# Patient Record
Sex: Female | Born: 1995 | Race: White | Hispanic: No | Marital: Single | Smoking: Never smoker
Health system: Southern US, Community
[De-identification: ages and names within clinical notes are randomized; demographics above are authoritative.]

## PROBLEM LIST (undated history)

## (undated) DIAGNOSIS — G9050 Complex regional pain syndrome I, unspecified: Secondary | ICD-10-CM

## (undated) HISTORY — DX: Complex regional pain syndrome I, unspecified: G90.50

## (undated) HISTORY — PX: ARM DEBRIDEMENT: SHX890

## (undated) HISTORY — PX: WISDOM TOOTH EXTRACTION: SHX21

---
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
## 2017-01-26 ENCOUNTER — Ambulatory Visit (INDEPENDENT_AMBULATORY_CARE_PROVIDER_SITE_OTHER): Payer: BC Managed Care – PPO | Admitting: Neurology

## 2017-01-26 ENCOUNTER — Encounter: Payer: Self-pay | Admitting: Neurology

## 2017-01-26 VITALS — BP 112/70 | HR 88 | Ht 64.0 in | Wt 112.0 lb

## 2017-01-26 DIAGNOSIS — R51 Headache: Secondary | ICD-10-CM | POA: Diagnosis not present

## 2017-01-26 DIAGNOSIS — G43709 Chronic migraine without aura, not intractable, without status migrainosus: Secondary | ICD-10-CM

## 2017-01-26 DIAGNOSIS — R519 Headache, unspecified: Secondary | ICD-10-CM

## 2017-01-26 MED ORDER — SUMATRIPTAN SUCCINATE 100 MG PO TABS: ORAL_TABLET | ORAL | 3 refills | Status: DC

## 2017-01-26 MED ORDER — TOPIRAMATE 50 MG PO TABS: 50.0000 mg | ORAL_TABLET | Freq: Every day | ORAL | 3 refills | Status: DC

## 2017-01-26 NOTE — Progress Notes (Signed)
NEUROLOGY CONSULTATION NOTE  Natasha James MRN: [id number] DOB: 01-Dec-1996  Referring provider: Dr. Henrietta Hoover Primary care provider: Dr. Melinda Crutch  Reason for consult:  Chronic migraine  HISTORY OF PRESENT ILLNESS: Natasha James is a 21 year old female with CRPS who presents for headache.  She is accompanied by her friend who supplements history.  Onset:  She has had headaches since around age 88.  They used to occur once a month.  Beginning in September 2017, then have become almost daily.  She is not certain about a specific trigger, but she was experiencing increased pain due to her CRPS, which is now controlled.  However, headaches persisted. Location:  Bi-frontal/temporal Quality:  Pressure/throbbing Intensity:  7-8/10 Aura:  no Prodrome:  no Associated symptoms:  Nausea, photophobia, blurred vision, decreased concentration.  She has not had any new worse headache of her life, waking up from sleep Duration:  Usually 1 day (up to 2 to 3 days) Frequency:  At least 15 headache days per month (usually has dull headache most days of month). Frequency of abortive medication: none Triggers/exacerbating factors:  uncertain Relieving factors:  no Activity:  Aggravates it. Of note, she also has diffuse joint pain.  Past NSAIDS:  Advil Past analgesics:  Tylenol, Excedrin Past abortive triptans:  no Past muscle relaxants:  no Past anti-emetic:  no Past antihypertensive medications:  no Past antidepressant medications:  Yes but cannot remember (took it for depression) Past anticonvulsant medications:  Lyrica and gabapentin (for CRPS) Past vitamins/Herbal/Supplements:  no Past antihistamines/decongestants:  no Other past therapies:  no  Current NSAIDS:  no Current analgesics:  no Current triptans:  no Current anti-emetic:  no Current muscle relaxants:  no Current anti-anxiolytic:  no Current sleep aide:  no Current Antihypertensive medications:  no Current  Antidepressant medications:  no Current Anticonvulsant medications:  no Current Vitamins/Herbal/Supplements:  Vitamin C, D Current Antihistamines/Decongestants:  no Other therapy:  no  Caffeine:  no Alcohol:  no Smoker:  no Diet:  Needs to drink more water.  Drinks soda Exercise:  No due to joint pain. Depression/anxiety:  depression Sleep hygiene:  good Family history of headache:  Mom  Labs this month: CBC with WBC 7.8, HGB 15.2, HCT 45.6, PLT 247; CMP with Na 144, K 4.3, Cl 100, CO2 22, BUN 15, Cr 0.79, glucose 93, total bili 0.3, ALP 75, AST 45 and ALT 47; B12 532; TSH 4.270; ferritin 36, RA negative; Hep B panel negative; ANCA negative; CRP negative; ANA negative.  Her LFTs are being monitored.  PAST MEDICAL HISTORY: Past Medical History:  Diagnosis Date  . CRPS (complex regional pain syndrome type I)     PAST SURGICAL HISTORY: Past Surgical History:  Procedure Laterality Date  . ARM DEBRIDEMENT     ulner shortening  . WISDOM TOOTH EXTRACTION      MEDICATIONS: No current outpatient prescriptions on file prior to visit.   No current facility-administered medications on file prior to visit.     ALLERGIES: Allergies  Allergen Reactions  . Codeine Itching and Rash  . Penicillins Nausea And Vomiting    FAMILY HISTORY: Family History  Problem Relation Age of Onset  . Chronic fatigue Mother   . Fibromyalgia Mother   . Hypertension Father     SOCIAL HISTORY: Social History   Social History  . Marital status: Unknown    Spouse name: N/A  . Number of children: N/A  . Years of education: N/A   Occupational History  . Not on  file.   Social History Main Topics  . Smoking status: Never Smoker  . Smokeless tobacco: Never Used  . Alcohol use No  . Drug use: No  . Sexual activity: Not on file   Other Topics Concern  . Not on file   Social History Narrative  . No narrative on file    REVIEW OF SYSTEMS: Constitutional: No fevers, chills, or sweats, no  generalized fatigue, change in appetite Eyes: No visual changes, double vision, eye pain Ear, nose and throat: No hearing loss, ear pain, nasal congestion, sore throat Cardiovascular: No chest pain, palpitations Respiratory:  No shortness of breath at rest or with exertion, wheezes GastrointestinaI: No nausea, vomiting, diarrhea, abdominal pain, fecal incontinence Genitourinary:  No dysuria, urinary retention or frequency Musculoskeletal:  No neck pain, back pain Integumentary: No rash, pruritus, skin lesions Neurological: as above Psychiatric: No depression, insomnia, anxiety Endocrine: No palpitations, fatigue, diaphoresis, mood swings, change in appetite, change in weight, increased thirst Hematologic/Lymphatic:  No purpura, petechiae. Allergic/Immunologic: no itchy/runny eyes, nasal congestion, recent allergic reactions, rashes  PHYSICAL EXAM: Vitals:   01/26/17 1458  BP: 112/70  Pulse: 88   General: No acute distress.  Patient appears well-groomed.  Head:  Normocephalic/atraumatic Eyes:  fundi examined but not visualized Neck: supple, no paraspinal tenderness, full range of motion Back: No paraspinal tenderness Heart: regular rate and rhythm Lungs: Clear to auscultation bilaterally. Vascular: No carotid bruits. Neurological Exam: Mental status: alert and oriented to person, place, and time, recent and remote memory intact, fund of knowledge intact, attention and concentration intact, speech fluent and not dysarthric, language intact. Cranial nerves: CN I: not tested CN II: pupils equal, round and reactive to light, visual fields intact CN III, IV, VI:  full range of motion, no nystagmus, no ptosis CN V: facial sensation intact CN VII: upper and lower face symmetric CN VIII: hearing intact CN IX, X: gag intact, uvula midline CN XI: sternocleidomastoid and trapezius muscles intact CN XII: tongue midline Bulk & Tone: normal, no fasciculations. Motor:  5/5 throughout    Sensation: temperature and vibration sensation intact. Deep Tendon Reflexes:  2+ throughout, toes downgoing.  Finger to nose testing:  Without dysmetria.  Heel to shin:  Without dysmetria.  Gait:  Normal station and stride.  Able to turn and tandem walk. Romberg negative.  IMPRESSION: Chronic migraines, worsening frequency from once a month to almost daily  PLAN: 1.  We will start topiramate (Topamax) 50mg  at bedtime.  We discussed side effects, including adverse effects on an unborn baby, so advised not to get pregnant while taking this medication. 2.  Sumatriptan 100mg  for abortive therapy. 3.  Limit use of pain relievers to no more than 2 days out of the week.  These medications include acetaminophen, ibuprofen, triptans and narcotics.  This will help reduce risk of rebound headaches. 4.  Be aware of common food triggers such as processed sweets, processed foods with nitrites (such as deli meat, hot dogs, sausages), foods with MSG, alcohol (such as wine), chocolate, certain cheeses, certain fruits (dried fruits, some citrus fruit), vinegar, diet soda. 4.  Avoid caffeine 5.  Routine exercise 6.  Proper sleep hygiene 7.  Stay adequately hydrated with water 8.  Keep a headache diary. 9.  Maintain proper stress management. 10.  Do not skip meals. 11.  Consider supplements:  Magnesium citrate 400mg  to 600mg  daily, riboflavin 400mg , Coenzyme Q 10 100mg  three times daily 12.  Due to persistent worsening increased headaches, we will get  MRI of brain without contrast. 13.  Follow up in 3 months but contact me in 4 weeks with update.  Thank you for allowing me to take part in the care of this patient.  Shon MilletAdam Jaffe, DO  CC:  Atha StarksKim Kylstra, MD  Linus Makoenis Sutherland-Phillips, MD

## 2017-01-26 NOTE — Patient Instructions (Addendum)
Migraine Recommendations: 1.  We will start topiramate (Topamax) 50mg  tablets.  We will increase the dose as follows to goal of 100mg  twice daily:      Morning Evening Week 1:   0.5 tab  0.5 tab Week 2:    1 tab  1 tab Week 3:   1.5 tabs 1.5 tabs Week 4 and thereafter 2 tabs  2 tabs.  Possible side effects include: impaired thinking, sedation, paresthesias (numbness and tingling) and weight loss.  It may cause dehydration and there is a small risk for kidney stones, so make sure to stay hydrated with water during the day.  There is also a very small risk for glaucoma, so if you notice any change in your vision while taking this medication, see an ophthalmologist.  As topiramate may have adverse effects on an unborn baby, do not get pregnant while taking this medication. 2.  Take sumatriptan 100mg  at earliest onset of headache.  May repeat dose once in 2 hours if needed.  Do not exceed two tablets in 24 hours. 3.  Limit use of pain relievers to no more than 2 days out of the week.  These medications include acetaminophen, ibuprofen, triptans and narcotics.  This will help reduce risk of rebound headaches. 4.  Be aware of common food triggers such as processed sweets, processed foods with nitrites (such as deli meat, hot dogs, sausages), foods with MSG, alcohol (such as wine), chocolate, certain cheeses, certain fruits (dried fruits, some citrus fruit), vinegar, diet soda. 4.  Avoid caffeine 5.  Routine exercise 6.  Proper sleep hygiene 7.  Stay adequately hydrated with water 8.  Keep a headache diary. 9.  Maintain proper stress management. 10.  Do not skip meals. 11.  Consider supplements:  Magnesium citrate 400mg  to 600mg  daily, riboflavin 400mg , Coenzyme Q 10 100mg  three times daily 12.  Since you have been having worsening increased headaches, we will get MRI of brain without contrast. We have sent a referral to [facility] and they will call you directly to schedule your  appt. They are located at [address] If you need to contact them directly please call [phone]. 13.  Follow up in 3 months but contact me in 4 weeks with update.

## 2017-01-26 NOTE — Progress Notes (Signed)
Note sent to doctor's listed.

## 2017-02-03 ENCOUNTER — Ambulatory Visit
Admission: RE | Admit: 2017-02-03 | Discharge: 2017-02-03 | Disposition: A | Discharge status: Home / Self Care | Payer: BC Managed Care – PPO | Source: Ambulatory Visit | Attending: Neurology | Admitting: Neurology

## 2017-02-03 ENCOUNTER — Telehealth: Payer: Self-pay | Admitting: Neurology

## 2017-02-03 DIAGNOSIS — R9082 White matter disease, unspecified: Secondary | ICD-10-CM

## 2017-02-03 DIAGNOSIS — G43709 Chronic migraine without aura, not intractable, without status migrainosus: Secondary | ICD-10-CM

## 2017-02-03 DIAGNOSIS — R51 Headache: Principal | ICD-10-CM

## 2017-02-03 DIAGNOSIS — R519 Headache, unspecified: Secondary | ICD-10-CM

## 2017-02-03 MED ORDER — NAPROXEN 500 MG PO TABS: 500.0000 mg | ORAL_TABLET | Freq: Two times a day (BID) | ORAL | 0 refills | Status: DC | PRN

## 2017-02-03 NOTE — Telephone Encounter (Signed)
Order's placed. Spoke with patient. Will stop by to have labs drawn. Will call to schedule MRA and LP at [facility] ([address] [place], [place] [address] ph: [phone] fax: [phone]).

## 2017-02-03 NOTE — Telephone Encounter (Signed)
[facility].  It revealed nonspecific punctate hyperintensities in the subcortical white matter.  It seems more prominent that sequela of migraine.  I explained that we will need to pursue further testing, specifically to evaluate for a vasculitis.  1.  We will check bloodwork for Sjogren's, ACE and hypercoagulable panel. 2.  We will have to set up for a lumbar puncture to test the CSF for cell count with diff, protein, glucose, oligoclonal bands, IgG index and ACE. 3.  We will schedule for MRA of the head.  She has a follow up appointment in June, but I would like to move that up soon after all the testing is completed.  Since there may be a vasculitis, I instructed to stop the sumatriptan for now and I will instead prescribe her naproxen 500mg , which she may take every 12 hours as needed for acute headache, but limited to no more than 2 days out of the week.  I answered all questions to the best of my ability.

## 2017-02-03 NOTE — Addendum Note (Signed)
Addended by: Sheilah MinsFOX, Latica Hohmann A on: 02/03/2017 01:00 PM   Modules accepted: Orders

## 2017-02-05 ENCOUNTER — Other Ambulatory Visit: Payer: BC Managed Care – PPO

## 2017-02-05 ENCOUNTER — Other Ambulatory Visit: Payer: Self-pay

## 2017-02-05 DIAGNOSIS — R9082 White matter disease, unspecified: Secondary | ICD-10-CM

## 2017-02-06 ENCOUNTER — Encounter: Payer: Self-pay | Admitting: Radiology

## 2017-02-06 ENCOUNTER — Ambulatory Visit
Admission: RE | Admit: 2017-02-06 | Discharge: 2017-02-06 | Disposition: A | Discharge status: Home / Self Care | Payer: BC Managed Care – PPO | Source: Ambulatory Visit | Attending: Neurology | Admitting: Neurology

## 2017-02-06 VITALS — BP 103/68 | HR 72

## 2017-02-06 DIAGNOSIS — R9082 White matter disease, unspecified: Secondary | ICD-10-CM

## 2017-02-06 LAB — CSF CELL COUNT WITH DIFFERENTIAL
RBC COUNT CSF: 4 {cells}/uL (ref 0–10)
WBC, CSF: 0 cells/uL (ref 0–5)

## 2017-02-06 LAB — PROTEIN, CSF: Total Protein, CSF: 40 mg/dL (ref 15–45)

## 2017-02-06 LAB — SJOGRENS SYNDROME-B EXTRACTABLE NUCLEAR ANTIBODY: SSB (La) (ENA) Antibody, IgG: <1

## 2017-02-06 LAB — SJOGRENS SYNDROME-A EXTRACTABLE NUCLEAR ANTIBODY: SSA (RO) (ENA) ANTIBODY, IGG: NEGATIVE

## 2017-02-06 LAB — GLUCOSE, CSF: GLUCOSE CSF: 71 mg/dL (ref 43–76)

## 2017-02-06 NOTE — Discharge Instructions (Signed)

## 2017-02-06 NOTE — Progress Notes (Signed)
One SST [facility] labs on second attempt; both sites unremarkable.  jkl

## 2017-02-07 LAB — RFX DRVVT SCR W/RFLX CONF 1:1 MIX: dRVVT Screen: 26 s (ref <=45)

## 2017-02-07 LAB — RFLX HEXAGONAL PHASE CONFIRM: HEXAGONAL PHASE CONFIRM: NEGATIVE

## 2017-02-07 LAB — RFX PTT-LA W/RFX TO HEX PHASE CONF: PTT-LA Screen: 43 s — ABNORMAL HIGH (ref <=40)

## 2017-02-07 LAB — ANGIOTENSIN CONVERTING ENZYME, CSF: ACE, CSF: 3 U/L (ref <=15)

## 2017-02-08 ENCOUNTER — Ambulatory Visit
Admission: RE | Admit: 2017-02-08 | Discharge: 2017-02-08 | Disposition: A | Discharge status: Home / Self Care | Payer: BC Managed Care – PPO | Source: Ambulatory Visit | Attending: Neurology | Admitting: Neurology

## 2017-02-08 DIAGNOSIS — R9082 White matter disease, unspecified: Secondary | ICD-10-CM

## 2017-02-09 ENCOUNTER — Telehealth: Payer: Self-pay | Admitting: Neurology

## 2017-02-09 LAB — HYPERCOAGULABLE PANEL, COMPREHENSIVE
AntiThromb III Func: 109 % activity (ref 80–120)
Anticardiolipin IgM: <12 [MPL'U]
Beta-2 Glyco I IgG: 9 SGU (ref <=20)
Beta-2-Glycoprotein I IgM: <9 SMU (ref <=20)
PROTEIN S ANTIGEN, TOTAL: 81 % (ref 70–140)
Protein C Activity: 86 % (ref 70–180)
Protein C Antigen: 95 % (ref 70–140)
Protein S Activity: 64 % (ref 60–140)

## 2017-02-09 LAB — CNS IGG SYNTHESIS RATE, CSF+BLOOD
ALBUMIN, SERUM(NEPH): 4.2 g/dL (ref 3.7–5.1)
Albumin, CSF: 21.5 mg/dL (ref 8.0–42.0)
IGG INDEX, CSF: 0.42 (ref <0.66)
IGG, SERUM: 834 mg/dL (ref 694–1618)
IgG, CSF: 1.8 mg/dL (ref 0.8–7.7)
MS CNS IgG Synthesis Rate: -3.7 mg/24 h (ref <=3.3)

## 2017-02-09 NOTE — Telephone Encounter (Signed)
-----   Message from Drema DallasAdam R Jaffe, DO sent at 02/09/2017  7:18 AM EDT ----- MRA of head is normal.  Still waiting on results of CSF

## 2017-02-09 NOTE — Telephone Encounter (Signed)
Patient made aware.

## 2017-02-10 ENCOUNTER — Other Ambulatory Visit: Payer: Self-pay | Admitting: Family Medicine

## 2017-02-10 DIAGNOSIS — R102 Pelvic and perineal pain: Secondary | ICD-10-CM

## 2017-02-10 DIAGNOSIS — R1084 Generalized abdominal pain: Secondary | ICD-10-CM

## 2017-02-10 LAB — CSF CULTURE
GRAM STAIN: NONE SEEN
ORGANISM ID, BACTERIA: NO GROWTH

## 2017-02-10 LAB — OLIGOCLONAL BANDS, CSF + SERM

## 2017-02-10 LAB — CSF CULTURE W GRAM STAIN: Gram Stain: NONE SEEN

## 2017-02-11 LAB — ANAEROBIC CULTURE
GRAM STAIN: NONE SEEN
GRAM STAIN: NONE SEEN

## 2017-02-16 ENCOUNTER — Telehealth: Payer: Self-pay | Admitting: Neurology

## 2017-02-16 NOTE — Telephone Encounter (Signed)
Spoke to patient. Gave results. Patient verbalized understanding. 

## 2017-02-16 NOTE — Telephone Encounter (Signed)
Pls let her know the bloodwork and spinal tap results are normal. Thanks

## 2017-02-16 NOTE — Telephone Encounter (Signed)
Patient wants to know the test result of the labs and the spinal tap [phone]

## 2017-02-23 ENCOUNTER — Ambulatory Visit
Admission: RE | Admit: 2017-02-23 | Discharge: 2017-02-23 | Disposition: A | Discharge status: Home / Self Care | Payer: BC Managed Care – PPO | Source: Ambulatory Visit | Attending: Family Medicine | Admitting: Family Medicine

## 2017-02-23 ENCOUNTER — Telehealth: Payer: Self-pay | Admitting: Neurology

## 2017-02-23 DIAGNOSIS — R1084 Generalized abdominal pain: Secondary | ICD-10-CM

## 2017-02-23 DIAGNOSIS — R102 Pelvic and perineal pain: Secondary | ICD-10-CM

## 2017-02-23 NOTE — Telephone Encounter (Signed)
Patient says she forgot to give 4 week up date when I called earlier w/ lab results. Update:  HA's have increased daily. Does not feel topamax or sumatriptan are helping.

## 2017-02-23 NOTE — Telephone Encounter (Signed)
Called patient. Gave lab results and instructions. Patient verbalized understanding.   

## 2017-02-23 NOTE — Telephone Encounter (Signed)
Patient called back with some questions. Thanks

## 2017-02-23 NOTE — Telephone Encounter (Signed)
Patient needs to talk to someone about what Dr Everlena CooperJaffe wants her to do now

## 2017-02-23 NOTE — Telephone Encounter (Signed)
-----   Message from Drema DallasAdam R Jaffe, DO sent at 02/23/2017  7:16 AM EDT ----- All labs look okay.  No further testing at this time.  She should follow up as scheduled.

## 2017-02-24 MED ORDER — RIZATRIPTAN BENZOATE 10 MG PO TBDP: 10.0000 mg | ORAL_TABLET | ORAL | 2 refills | Status: DC | PRN

## 2017-02-24 MED ORDER — TOPIRAMATE 100 MG PO TABS
100.0000 mg | ORAL_TABLET | Freq: Every day | ORAL | 2 refills | Status: DC
Start: 2017-02-24 — End: 2023-03-30

## 2017-02-24 NOTE — Telephone Encounter (Signed)
Patient made aware. Prescriptions sent to pharmacy.

## 2017-02-24 NOTE — Telephone Encounter (Signed)
1.  I would increase topiramate to 100mg  at bedtime. 2.  Instead of sumatriptan, she should take rizatriptan 10mg  at earliest onset of headache.  May repeat dose once after two hours if needed (not to exceed 2 tablets in 24 hours).  She should contact us if it is not effective. 3.  She should limit use of pain relievers to no more than 2 days out of the week (this includes Tylenol, ibuprofen, Excedrin, Advil, Motrin, Aleve). 4.  In 4 weeks, she should contact us again and we can adjust topamax if needed

## 2017-02-25 ENCOUNTER — Telehealth: Payer: Self-pay | Admitting: Neurology

## 2017-02-25 NOTE — Telephone Encounter (Signed)
Spoke to pt. Advised copies of testing ready for pick up @ front desk. Pt verbalized understanding.

## 2017-02-25 NOTE — Telephone Encounter (Signed)
Patient would like a copy of her MRA, MRI and blood tests. She asked could she pick the copies up? She would like you to call her when ready. Thanks

## 2017-05-07 ENCOUNTER — Ambulatory Visit: Payer: BC Managed Care – PPO | Admitting: Neurology

## 2017-07-24 IMAGING — US US PELVIS COMPLETE
1 series · 14 of 25 positions shown · non-contrast
Comparison: None in PACs

CLINICAL DATA: Right-sided pelvic pain for the past month. The
patient has an IUD which has been in place for the past year.



[Series 1: us pelvis complete · 0.15mm/px · 14 of 63 slices shown]
[im 1/63]
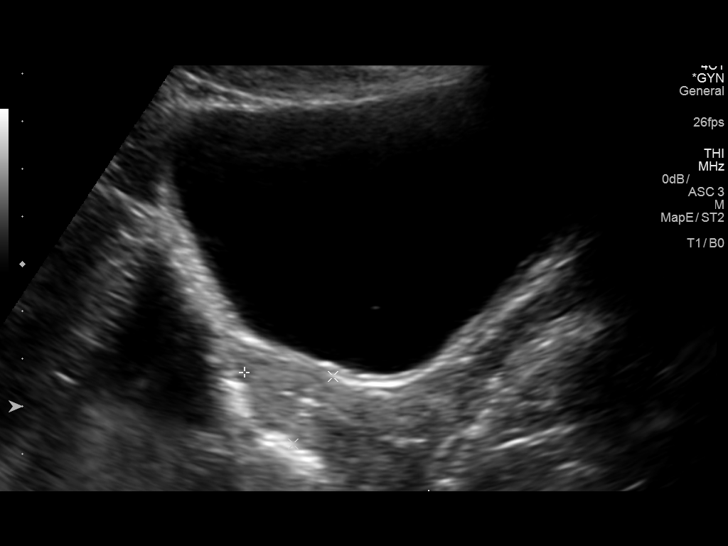
[im 6/63]
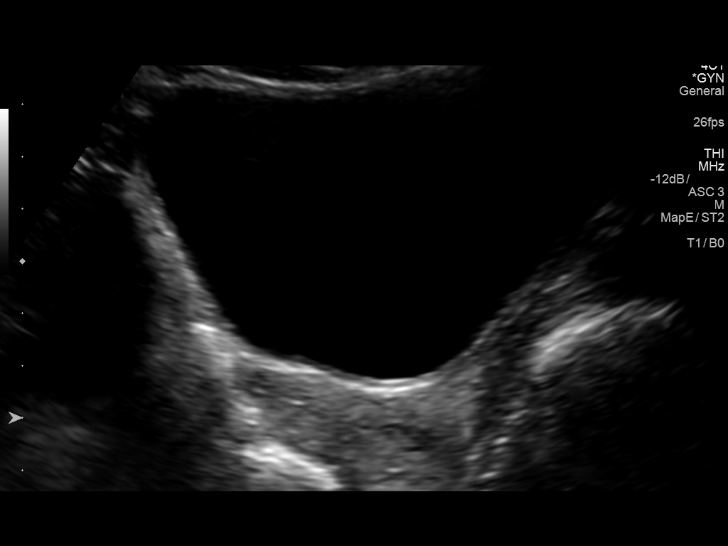
[im 11/63]
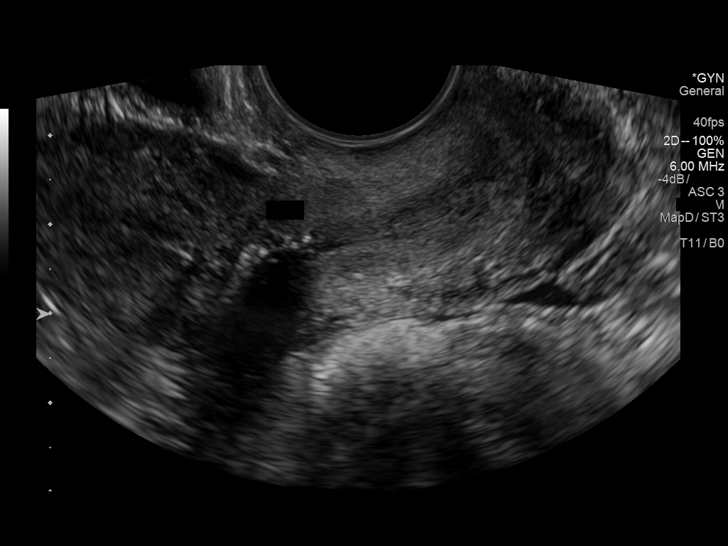
[im 16/63]
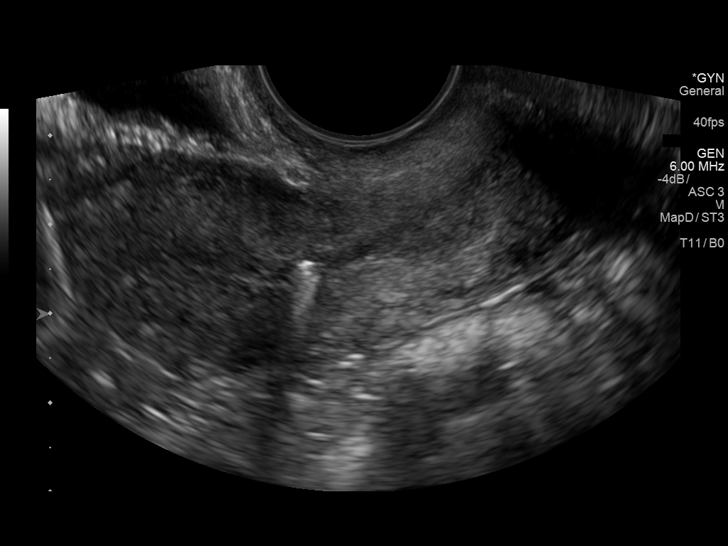
[im 21/63]
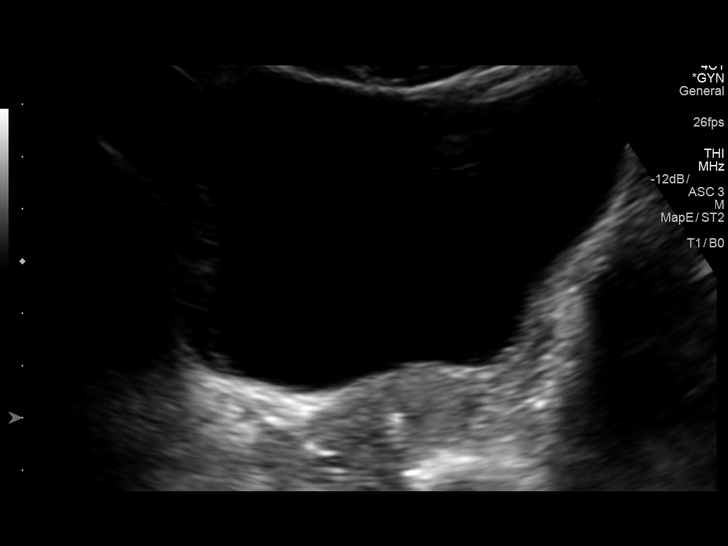
[im 24/63]
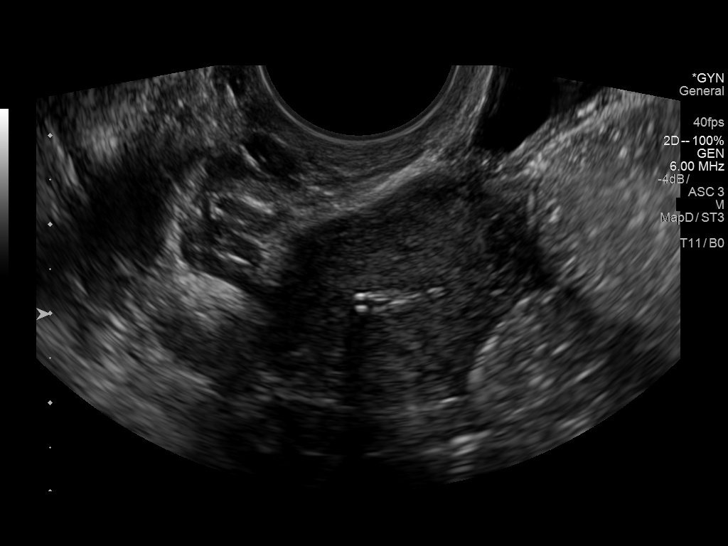
[im 29/63]
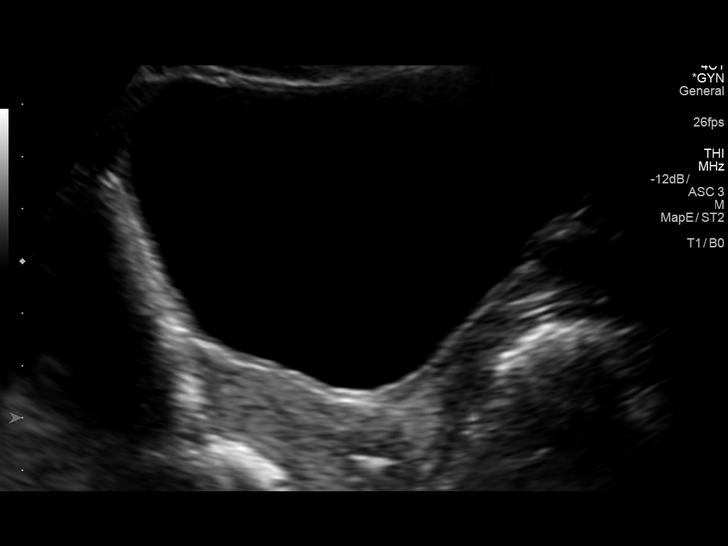
[im 34/63]
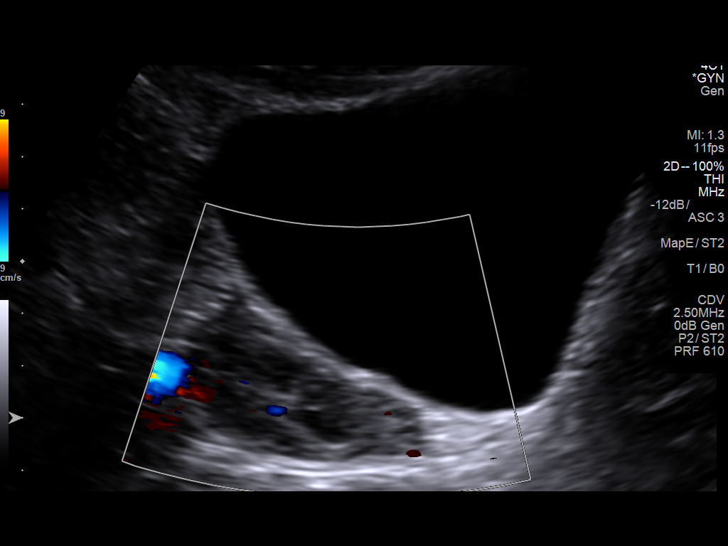
[im 39/63]
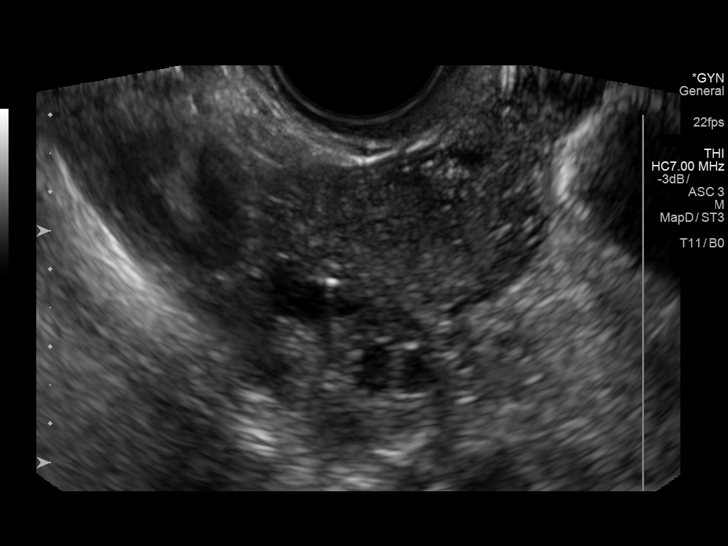
[im 42/63]
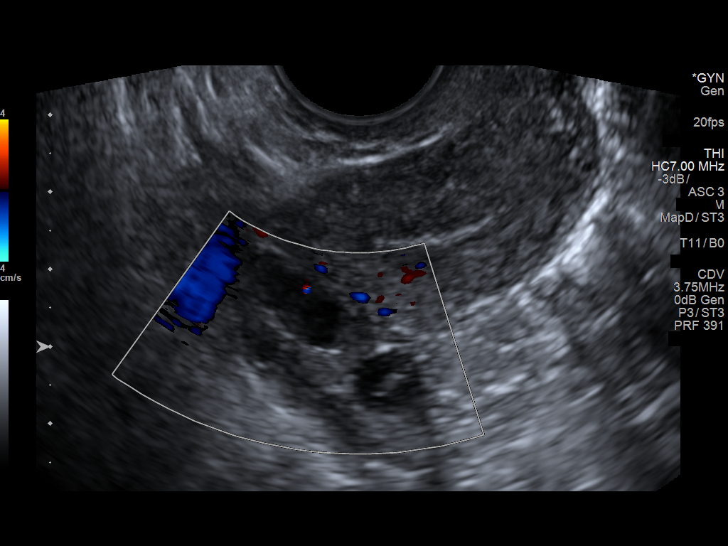
[im 47/63]
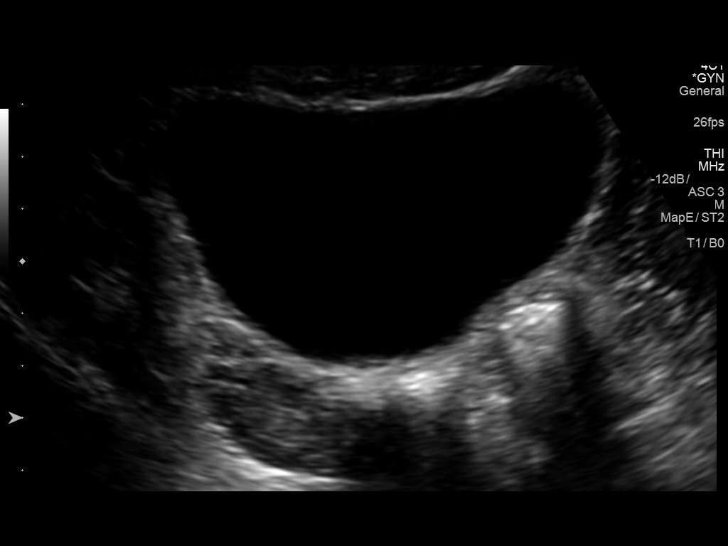
[im 52/63]
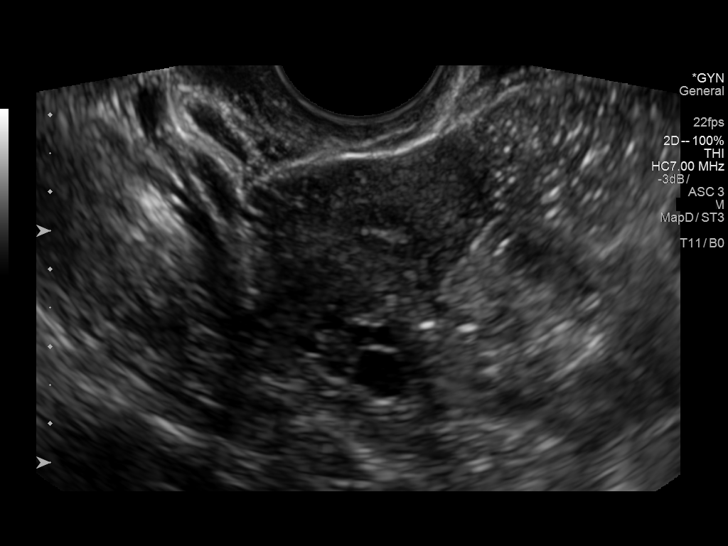
[im 57/63]
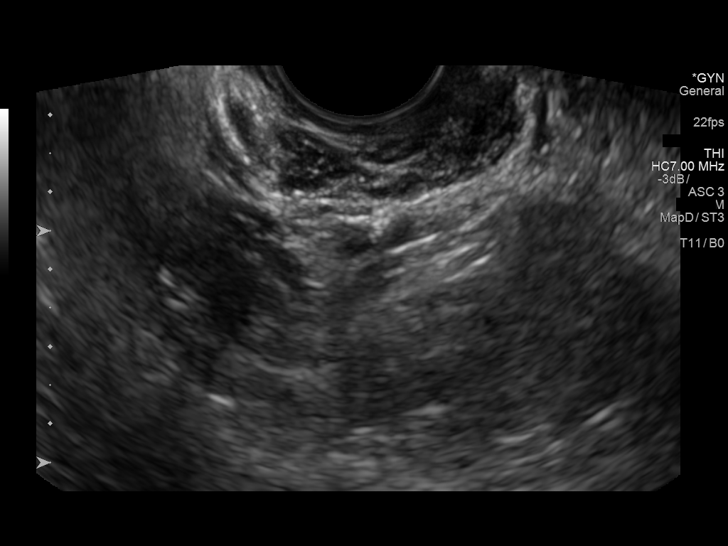
[im 63/63]
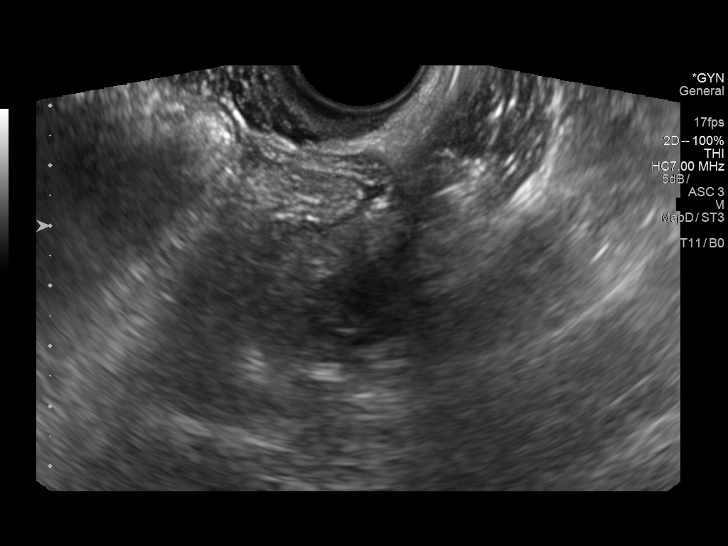

[14 of 25 positions shown; findings below may reference images not displayed]

FINDINGS: Uterus

Measurements: 5.3 x 2.2 x 2.4 cm. No fibroids or other mass
visualized.

Endometrium

Thickness: 2.8 mm.  No focal abnormality visualized.

Right ovary

Measurements: 3.8 x 1.8 x 1.7 cm. Normal appearance/no adnexal mass.

Left ovary

Measurements: The left ovary was not visualized.. No adnexal mass is
observed.

Other findings

No abnormal free fluid.
IMPRESSION: Nonvisualization of the left ovary. Normal appearance of the uterus,
endometrium, and right ovary. No adnexal masses are observed.

## 2018-09-15 ENCOUNTER — Other Ambulatory Visit: Payer: Self-pay | Admitting: Neurology

## 2018-09-16 ENCOUNTER — Other Ambulatory Visit: Payer: Self-pay | Admitting: Neurology

## 2018-09-17 ENCOUNTER — Other Ambulatory Visit: Payer: Self-pay | Admitting: Neurology

## 2018-09-17 DIAGNOSIS — G43719 Chronic migraine without aura, intractable, without status migrainosus: Secondary | ICD-10-CM

## 2018-09-17 DIAGNOSIS — G4452 New daily persistent headache (NDPH): Secondary | ICD-10-CM

## 2018-09-17 DIAGNOSIS — G43019 Migraine without aura, intractable, without status migrainosus: Secondary | ICD-10-CM

## 2018-09-25 ENCOUNTER — Ambulatory Visit
Admission: RE | Admit: 2018-09-25 | Discharge: 2018-09-25 | Disposition: A | Discharge status: Home / Self Care | Payer: BLUE CROSS/BLUE SHIELD | Source: Ambulatory Visit | Attending: Neurology | Admitting: Neurology

## 2018-09-25 DIAGNOSIS — G4452 New daily persistent headache (NDPH): Secondary | ICD-10-CM

## 2018-09-25 DIAGNOSIS — G43719 Chronic migraine without aura, intractable, without status migrainosus: Secondary | ICD-10-CM

## 2018-09-25 DIAGNOSIS — G43019 Migraine without aura, intractable, without status migrainosus: Secondary | ICD-10-CM

## 2018-09-25 MED ORDER — GADOBENATE DIMEGLUMINE 529 MG/ML IV SOLN
10.0000 mL | Freq: Once | INTRAVENOUS | Status: AC | PRN
  Administered 2018-09-25: 10 mL via INTRAVENOUS

## 2023-03-30 ENCOUNTER — Encounter: Payer: Self-pay | Admitting: Cardiology

## 2023-03-30 ENCOUNTER — Ambulatory Visit: Payer: BC Managed Care – PPO | Admitting: Cardiology

## 2023-03-30 VITALS — BP 96/66 | HR 76 | Ht 64.0 in | Wt 144.0 lb

## 2023-03-30 DIAGNOSIS — G90A Postural orthostatic tachycardia syndrome (POTS): Secondary | ICD-10-CM

## 2023-03-30 DIAGNOSIS — R6889 Other general symptoms and signs: Secondary | ICD-10-CM

## 2023-03-30 NOTE — Progress Notes (Unsigned)
Patient referred by Atha Starks, MD for decreased exercise tolerance  Subjective:   Natasha James, female    DOB: 26-Oct-1996, 26 y.o.   MRN: [id number]   Chief Complaint  Patient presents with   POTS (postural orthostatic tachycardia syndrome)   New Patient (Initial Visit)     HPI  27 y.o. Caucasian female with complex regional pain syndrome, referred for decreased exercise tolerance and lightheadedness.  Patient works at [employer] job.  She currently, her physical activity is extremely limited due to exercise-induced shortness of breath and lightheadedness.  Previously, she used to participate in competitive gymnastics, but used to have similar symptoms and back pain.  She denies any specific chest pain.  She does not drink alcohol or caffeine routinely.     Past Medical History:  Diagnosis Date   CRPS (complex regional pain syndrome type I)      Past Surgical History:  Procedure Laterality Date   ARM DEBRIDEMENT     ulner shortening   WISDOM TOOTH EXTRACTION       Social History   Tobacco Use  Smoking Status Never  Smokeless Tobacco Never    Social History   Substance and Sexual Activity  Alcohol Use No     Family History  Problem Relation Age of Onset   Chronic fatigue Mother    Fibromyalgia Mother    Hypertension Father      Current Outpatient Medications:    DULoxetine (CYMBALTA) 20 MG capsule, Take 20 mg by mouth daily., Disp: , Rfl:    propranolol (INDERAL) 20 MG tablet, Take 20 mg by mouth 3 (three) times daily., Disp: , Rfl:    Sertraline HCl 150 MG CAPS, Take by mouth., Disp: , Rfl:    Cardiovascular and other pertinent studies:  Reviewed external labs and tests, independently interpreted  EKG 03/30/2023: Sinus rhythm 67 bpm Normal EKG    Recent labs: 03/03/2022: Glucose 84, BUN/Cr 11/0.85. EGFR >90. Na/K 138/4.2. Rest of the CMP normal H/H 14/41. MCV 91. Platelets 266    Review of Systems  Constitutional: Positive for  malaise/fatigue.  Cardiovascular:  Negative for chest pain, dyspnea on exertion, leg swelling, palpitations and syncope.         Vitals:   03/30/23 1428 03/30/23 1429  BP: 100/85 105/73  Pulse: 84 78  SpO2: 99% 98%    Orthostatic VS for the past 72 hrs (Last 3 readings):  Orthostatic BP Patient Position BP Location Cuff Size Orthostatic Pulse  03/30/23 1429 105/73 Sitting Left Arm Normal 78  03/30/23 1428 100/85 Sitting Left Arm Normal 84  03/30/23 1427 102/76 Supine Left Arm Normal 70     Body mass index is 24.72 kg/m. Filed Weights   03/30/23 1421  Weight: 144 lb (65.3 kg)     Objective:   Physical Exam Vitals and nursing note reviewed.  Constitutional:      General: She is not in acute distress. Neck:     Vascular: No JVD.  Cardiovascular:     Rate and Rhythm: Normal rate and regular rhythm.     Heart sounds: Normal heart sounds. No murmur heard. Pulmonary:     Effort: Pulmonary effort is normal.     Breath sounds: Normal breath sounds. No wheezing or rales.  Musculoskeletal:     Right lower leg: No edema.     Left lower leg: No edema.          Visit diagnoses:   ICD-10-CM   1. POTS (postural orthostatic tachycardia  syndrome)  G90.A EKG 12-Lead       Orders Placed This Encounter  Procedures   EKG 12-Lead     Medication changes this visit: Medications Discontinued During This Encounter  Medication Reason   Ascorbic Acid (VITAMIN C) 1000 MG tablet    ergocalciferol (VITAMIN D2) 50000 units capsule    MULTIPLE VITAMIN PO    naproxen (NAPROSYN) 500 MG tablet    rizatriptan (MAXALT-MLT) 10 MG disintegrating tablet    topiramate (TOPAMAX) 100 MG tablet     No orders of the defined types were placed in this encounter.    Assessment & Recommendations:    27 y.o. Caucasian female with complex regional pain syndrome, referred for decreased exercise tolerance and lightheadedness.  Orthostatic vitals today do not support the diagnosis of  orthostatic hypotension or POTS.  Physical exam and EKG are normal.  My suspicion for cardiac etiology for symptoms is extremely low.  We can obtain exercise treadmill stress test to objectively assess her exercise capacity, as well as to look for any arrhythmia.  On further chart review, I do see a diagnosis of complex regional pain syndrome.  I do not know how much of her symptoms could be attributed to this diagnosis.  I will defer this to PCP. Also, please check baseline   Further recommendations after above testing.  Thank you for referring the patient to [place]. Please feel free to contact with any questions.   Elder Negus, MD Pager: [phone] Office: [phone]

## 2023-03-31 ENCOUNTER — Encounter: Payer: Self-pay | Admitting: Cardiology

## 2023-04-10 ENCOUNTER — Ambulatory Visit: Payer: BC Managed Care – PPO

## 2023-04-10 DIAGNOSIS — R6889 Other general symptoms and signs: Secondary | ICD-10-CM
